# Patient Record
Sex: Female | Born: 1995 | Race: White | Hispanic: No | Marital: Single | Smoking: Never smoker
Health system: Southern US, Community
[De-identification: ages and names within clinical notes are randomized; demographics above are authoritative.]

---

## 2020-01-01 ENCOUNTER — Ambulatory Visit (INDEPENDENT_AMBULATORY_CARE_PROVIDER_SITE_OTHER): Payer: 59

## 2020-01-01 ENCOUNTER — Ambulatory Visit
Admission: EM | Admit: 2020-01-01 | Discharge: 2020-01-01 | Disposition: A | Discharge status: Home / Self Care | Payer: 59 | Attending: Family Medicine | Admitting: Family Medicine

## 2020-01-01 DIAGNOSIS — X503XXA Overexertion from repetitive movements, initial encounter: Secondary | ICD-10-CM | POA: Diagnosis not present

## 2020-01-01 DIAGNOSIS — M7750 Other enthesopathy of unspecified foot: Secondary | ICD-10-CM | POA: Diagnosis not present

## 2020-01-01 DIAGNOSIS — S93402S Sprain of unspecified ligament of left ankle, sequela: Secondary | ICD-10-CM

## 2020-01-01 NOTE — Discharge Instructions (Addendum)
Do ankle exercises that we discussed

## 2020-01-01 NOTE — ED Provider Notes (Signed)
[facility]    CSN: [id number] Arrival date & time: 01/01/20  1004      History   Chief Complaint Chief Complaint  Patient presents with  . Ankle Pain    both     HPI Diana Hughes is a 24 y.o. female.   Patient complains of pain in both ankles.  She has started a workout program and was feeling the pain but yesterday stepped off a treadmill rolling her left ankle.  Now has pain in the lateral aspect along with bruising and swelling.  HPI  History reviewed. No pertinent past medical history.  There are no problems to display for this patient.   History reviewed. No pertinent surgical history.  OB History   No obstetric history on file.      Home Medications    Prior to Admission medications   Medication Sig Start Date End Date Taking? Authorizing Provider  dexmethylphenidate (FOCALIN XR) 15 MG 24 hr capsule Take by mouth. 12/03/19 01/02/20 Yes [provider]  Dexmethylphenidate HCl 30 MG CP24 Take by mouth. 12/03/19 01/02/20 Yes [provider]    Family History Family History  Problem Relation Age of Onset  . Healthy Mother   . Healthy Father     Social History Social History   Tobacco Use  . Smoking status: Never Smoker  . Smokeless tobacco: Never Used  Substance Use Topics  . Alcohol use: Not on file  . Drug use: Not on file     Allergies   Patient has no known allergies.   Review of Systems Review of Systems  Musculoskeletal: Positive for arthralgias, gait problem and joint swelling.  All other systems reviewed and are negative.    Physical Exam Triage Vital Signs ED Triage Vitals  Enc Vitals Group     BP 01/01/20 1019 117/72     Pulse Rate 01/01/20 1019 (!) 101     Resp 01/01/20 1019 16     Temp 01/01/20 1019 98.2 F (36.8 C)     Temp Source 01/01/20 1019 Oral     SpO2 01/01/20 1019 98 %     Weight --      Height --      Head Circumference --      Peak Flow --      Pain Score 01/01/20 1020 7      Pain Loc --      Pain Edu? --      Excl. in GC? --    No data found.  Updated Vital Signs BP 117/72 (BP Location: Right Arm)   Pulse (!) 101   Temp 98.2 F (36.8 C) (Oral)   Resp 16   SpO2 98%   Visual Acuity Right Eye Distance:   Left Eye Distance:   Bilateral Distance:    Right Eye Near:   Left Eye Near:    Bilateral Near:     Physical Exam Vitals and nursing note reviewed.  Constitutional:      Appearance: Normal appearance.  Musculoskeletal:     Comments: Left ankle there is bruising and swelling in the area between the lateral malleolus and foot. Strength is appropriate Right ankle there is pain and tenderness posteriorly in the area of the Achilles tendon but normal dorsiflexion plantarflexion  Neurological:     Mental Status: She is alert.      UC Treatments / Results  Labs (all labs ordered are listed, but only abnormal results are displayed) Labs Reviewed - No data  to display  EKG   RadiologyNo results found.  X-ray shows no evidence of fracture.  Ankle mortise is symmetric Procedures Procedures (including critical care time)  Medications Ordered in UC Medications - No data to display  Initial Impression / Assessment and Plan / UC Course  I have reviewed the triage vital signs and the nursing notes.  Pertinent labs & imaging results that were available during my care of the patient were reviewed by me and considered in my medical decision making (see chart for details).     Tendinitis and ankle sprain secondary to overuse and inversion injury respectively Final Clinical Impressions(s) / UC Diagnoses   Final diagnoses:  None   Discharge Instructions   None    ED Prescriptions    None     PDMP not reviewed this encounter.   Frederica Kuster, MD 01/01/20 1058

## 2020-01-01 NOTE — ED Triage Notes (Signed)
Pt present pain in both ankles, symptoms started two weeks ago.pt has been working out a lot, she states that the left ankle sort of rolled and cracked from intense work out. Both ankles are swollen but the left one has some discoloration.

## 2021-09-16 IMAGING — DX DG ANKLE COMPLETE 3+V*L*
3 series · 3 of 3 positions shown · non-contrast
Comparison: None.

CLINICAL DATA: Ankle injury with pain and swelling.

EXAM:
LEFT ANKLE COMPLETE - 3+ VIEW

[ankle ap]
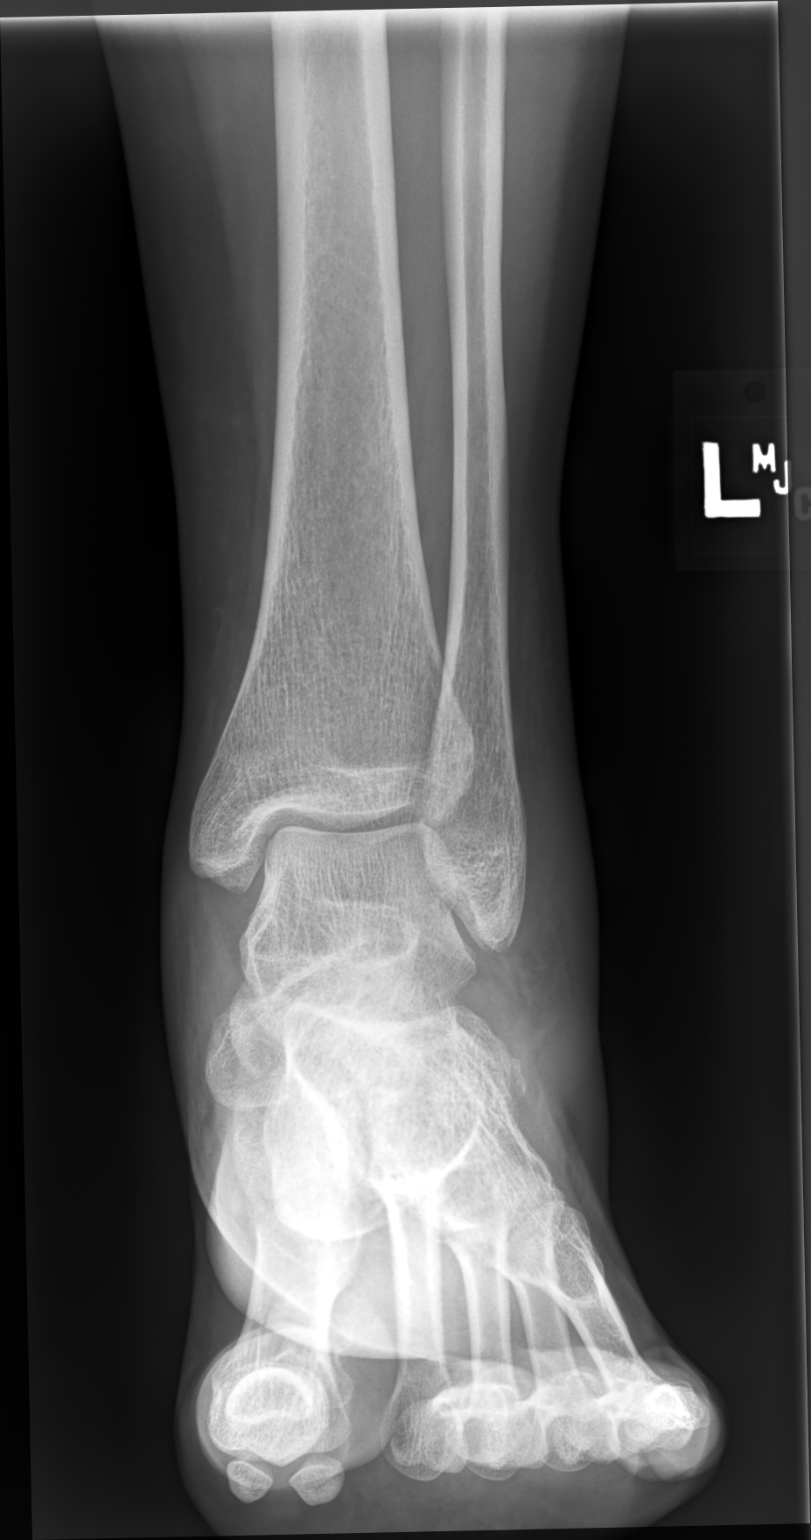

[ankle medial oblique]
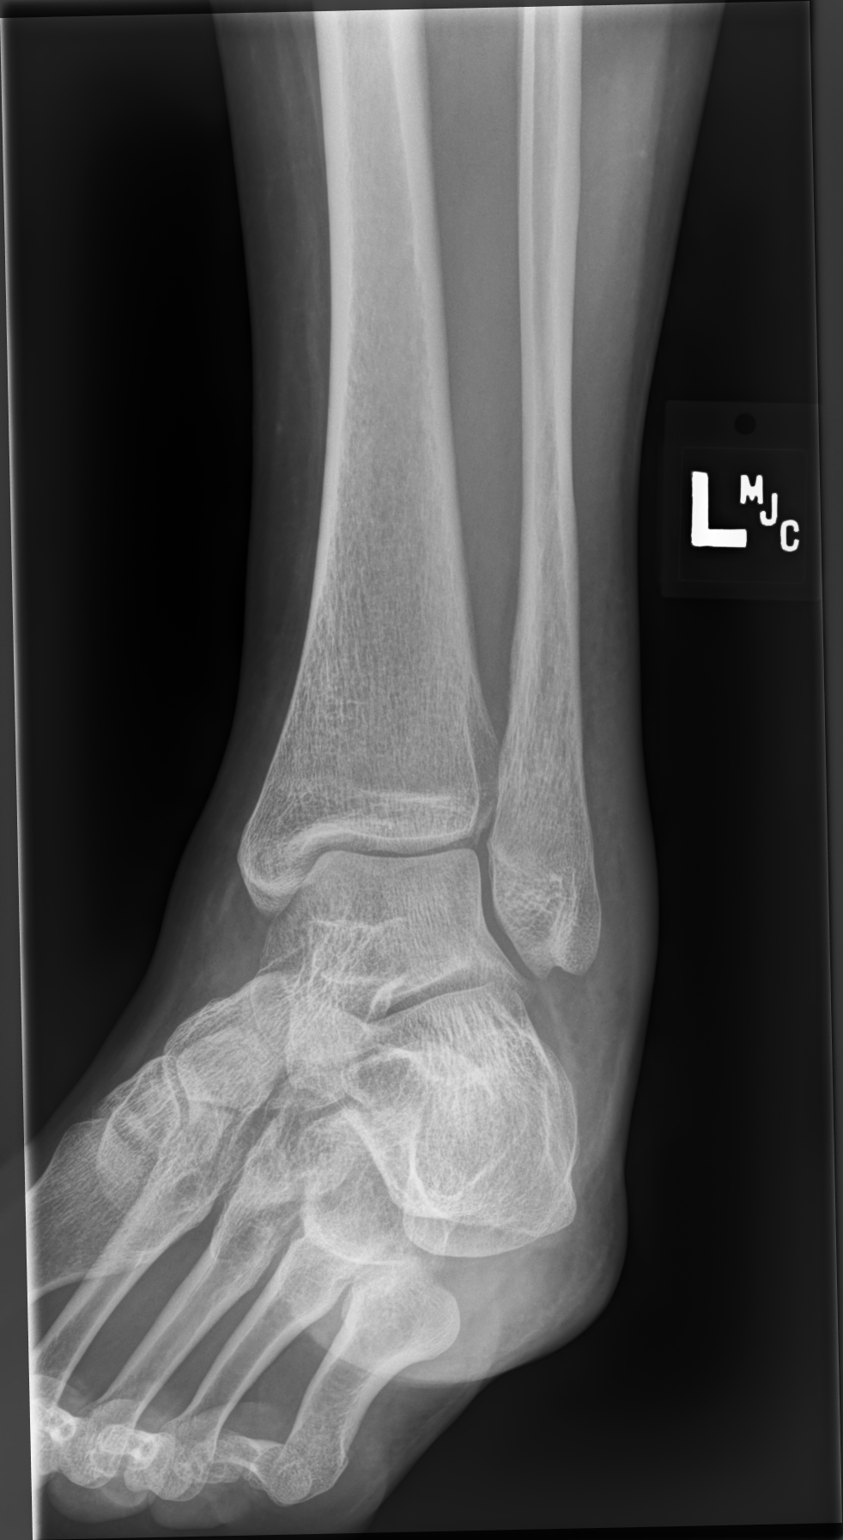

[ankle lat]
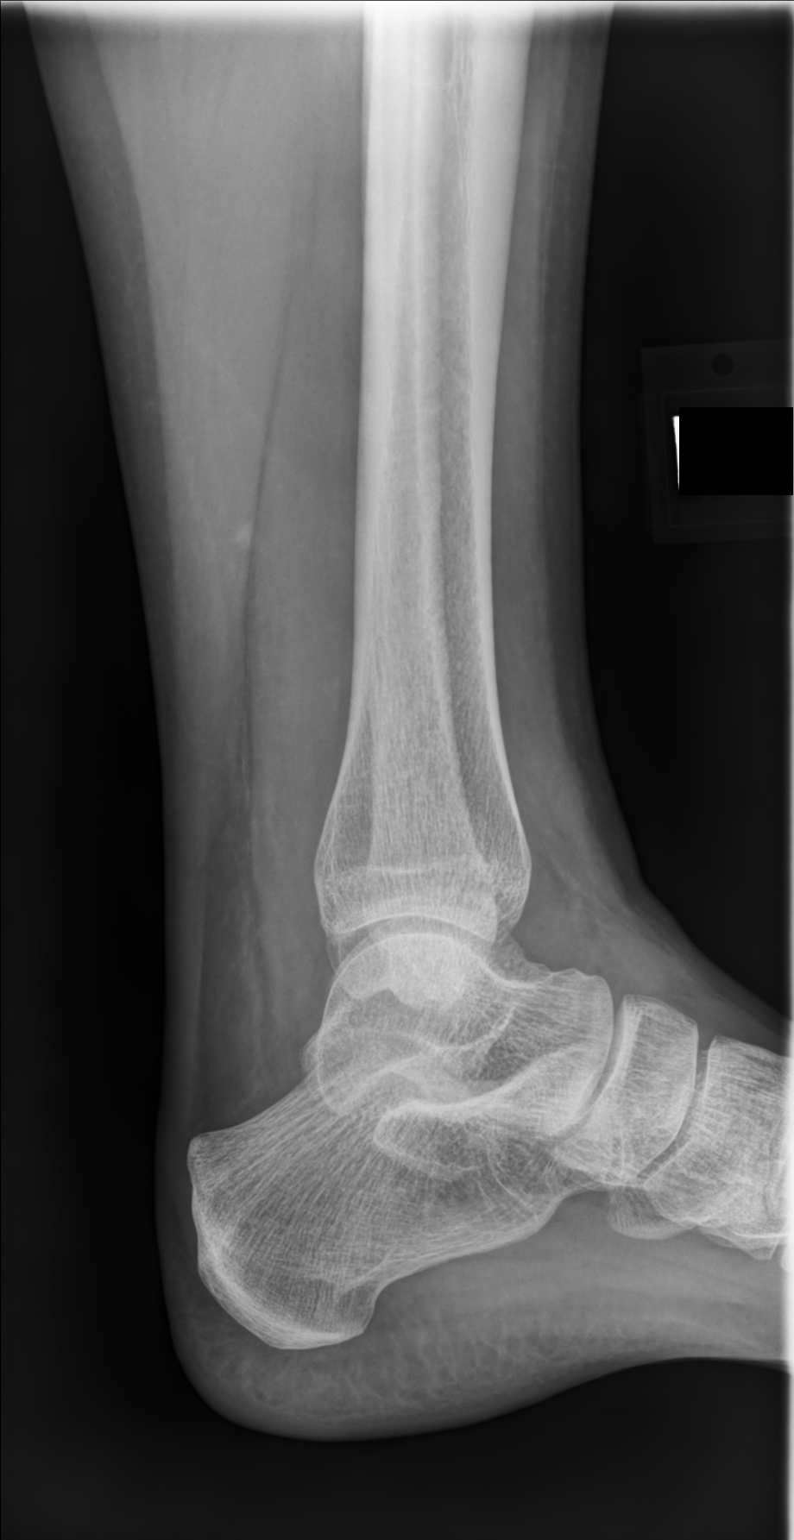

[3 of 3 positions shown; findings below may reference images not displayed]

FINDINGS: There is no evidence of fracture, dislocation, or joint effusion.
There is no evidence of arthropathy or other focal bone abnormality.
Soft tissues are unremarkable.
IMPRESSION: Negative.
